# Patient Record
Sex: Male | Born: 1980 | Hispanic: No | Marital: Single | Smoking: Never smoker
Health system: Southern US, Community
[De-identification: ages and names within clinical notes are randomized; demographics above are authoritative.]

---

## 1998-01-24 ENCOUNTER — Emergency Department (HOSPITAL_COMMUNITY): Admission: EM | Admit: 1998-01-24 | Discharge: 1998-01-24 | Payer: Self-pay | Admitting: *Deleted

## 1998-02-12 ENCOUNTER — Emergency Department (HOSPITAL_COMMUNITY): Admission: EM | Admit: 1998-02-12 | Discharge: 1998-02-12 | Payer: Self-pay | Admitting: Emergency Medicine

## 1998-08-01 ENCOUNTER — Encounter: Payer: Self-pay | Admitting: *Deleted

## 1998-08-01 ENCOUNTER — Emergency Department (HOSPITAL_COMMUNITY): Admission: EM | Admit: 1998-08-01 | Discharge: 1998-08-01 | Payer: Self-pay | Admitting: Emergency Medicine

## 2003-09-21 ENCOUNTER — Emergency Department (HOSPITAL_COMMUNITY): Admission: EM | Admit: 2003-09-21 | Discharge: 2003-09-21 | Payer: Self-pay | Admitting: Emergency Medicine

## 2007-03-14 ENCOUNTER — Emergency Department (HOSPITAL_COMMUNITY): Admission: EM | Admit: 2007-03-14 | Discharge: 2007-03-14 | Payer: Self-pay | Admitting: Emergency Medicine

## 2007-03-23 ENCOUNTER — Emergency Department (HOSPITAL_COMMUNITY): Admission: EM | Admit: 2007-03-23 | Discharge: 2007-03-23 | Payer: Self-pay | Admitting: Emergency Medicine

## 2007-04-17 ENCOUNTER — Emergency Department (HOSPITAL_COMMUNITY): Admission: EM | Admit: 2007-04-17 | Discharge: 2007-04-18 | Payer: Self-pay | Admitting: Emergency Medicine

## 2007-04-25 ENCOUNTER — Emergency Department (HOSPITAL_COMMUNITY): Admission: EM | Admit: 2007-04-25 | Discharge: 2007-04-25 | Payer: Self-pay | Admitting: Emergency Medicine

## 2007-12-18 ENCOUNTER — Emergency Department (HOSPITAL_COMMUNITY): Admission: EM | Admit: 2007-12-18 | Discharge: 2007-12-18 | Payer: Self-pay | Admitting: Emergency Medicine

## 2011-01-09 ENCOUNTER — Emergency Department (HOSPITAL_COMMUNITY)
Admission: EM | Admit: 2011-01-09 | Discharge: 2011-01-09 | Disposition: A | Discharge status: Home / Self Care | Payer: Self-pay | Attending: Emergency Medicine | Admitting: Emergency Medicine

## 2011-01-09 ENCOUNTER — Emergency Department (HOSPITAL_COMMUNITY): Payer: Self-pay

## 2011-01-09 DIAGNOSIS — F1010 Alcohol abuse, uncomplicated: Secondary | ICD-10-CM | POA: Insufficient documentation

## 2011-01-09 DIAGNOSIS — S40019A Contusion of unspecified shoulder, initial encounter: Secondary | ICD-10-CM | POA: Insufficient documentation

## 2011-01-09 DIAGNOSIS — M542 Cervicalgia: Secondary | ICD-10-CM | POA: Insufficient documentation

## 2011-01-09 DIAGNOSIS — R51 Headache: Secondary | ICD-10-CM | POA: Insufficient documentation

## 2011-01-09 DIAGNOSIS — R079 Chest pain, unspecified: Secondary | ICD-10-CM | POA: Insufficient documentation

## 2011-03-07 ENCOUNTER — Emergency Department (HOSPITAL_COMMUNITY): Payer: Self-pay

## 2011-03-07 ENCOUNTER — Emergency Department (HOSPITAL_COMMUNITY)
Admission: EM | Admit: 2011-03-07 | Discharge: 2011-03-07 | Disposition: A | Discharge status: Home / Self Care | Payer: Self-pay | Attending: Emergency Medicine | Admitting: Emergency Medicine

## 2011-03-07 DIAGNOSIS — S61409A Unspecified open wound of unspecified hand, initial encounter: Secondary | ICD-10-CM | POA: Insufficient documentation

## 2011-03-07 DIAGNOSIS — M79609 Pain in unspecified limb: Secondary | ICD-10-CM | POA: Insufficient documentation

## 2011-03-07 DIAGNOSIS — M7989 Other specified soft tissue disorders: Secondary | ICD-10-CM | POA: Insufficient documentation

## 2011-03-07 DIAGNOSIS — W2209XA Striking against other stationary object, initial encounter: Secondary | ICD-10-CM | POA: Insufficient documentation

## 2011-06-13 LAB — URINALYSIS, ROUTINE W REFLEX MICROSCOPIC
Glucose, UA: NEGATIVE
Specific Gravity, Urine: 1.024
pH: 6

## 2018-01-03 ENCOUNTER — Encounter (HOSPITAL_COMMUNITY): Payer: Self-pay | Admitting: Family Medicine

## 2018-01-03 ENCOUNTER — Ambulatory Visit (HOSPITAL_COMMUNITY)
Admission: EM | Admit: 2018-01-03 | Discharge: 2018-01-03 | Disposition: A | Discharge status: Home / Self Care | Payer: Self-pay | Attending: Urgent Care | Admitting: Urgent Care

## 2018-01-03 DIAGNOSIS — F12988 Cannabis use, unspecified with other cannabis-induced disorder: Secondary | ICD-10-CM

## 2018-01-03 DIAGNOSIS — R197 Diarrhea, unspecified: Secondary | ICD-10-CM

## 2018-01-03 DIAGNOSIS — F1290 Cannabis use, unspecified, uncomplicated: Secondary | ICD-10-CM

## 2018-01-03 DIAGNOSIS — R112 Nausea with vomiting, unspecified: Secondary | ICD-10-CM

## 2018-01-03 MED ORDER — ONDANSETRON 4 MG PO TBDP: 8.0000 mg | ORAL_TABLET | Freq: Once | ORAL | Status: DC

## 2018-01-03 MED ORDER — ONDANSETRON 4 MG PO TBDP
ORAL_TABLET | ORAL | Status: AC
  Filled 2018-01-03: qty 2

## 2018-01-03 MED ORDER — ONDANSETRON 4 MG PO TBDP: 4.0000 mg | ORAL_TABLET | Freq: Three times a day (TID) | ORAL | 0 refills | Status: DC | PRN

## 2018-01-03 NOTE — ED Triage Notes (Signed)
Pt here for fever, generalized abd pain and vomiting since yesterday.

## 2018-01-03 NOTE — ED Provider Notes (Signed)
  MRN: [id number] DOB: [birth year]  Subjective:    Raina Minaedro Huerta-Morales is a 37 y.o. male presenting for 1 day history of nausea with vomiting, loose stools.  Patient has had associated subjective fever and generalized belly pain.  He has not tried any medications for relief.  He is trying to stay well-hydrated but is not tolerating foods very well.  Admits that he smokes marijuana every day and has smoked marijuana for years.  Denies history of chronic GI issues including ulcerative colitis, Crohn's disease, GERD.  Not currently taking any medications and has No Known Allergies.  Nice past medical history and past surgical history.  Objective:   Vitals: BP (!) 143/76   Pulse 62   Temp 98.6 F (37 C)   Resp 18   SpO2 100%   Physical Exam  Constitutional: He is oriented to person, place, and time. He appears well-developed and well-nourished.  HENT:  Mucous membranes dry.  Eyes: No scleral icterus.  Cardiovascular: Normal rate, regular rhythm and intact distal pulses. Exam reveals no gallop and no friction rub.  No murmur heard. Pulmonary/Chest: No respiratory distress. He has no wheezes. He has no rales.  Abdominal: Soft. Bowel sounds are normal. He exhibits no distension and no mass. There is tenderness (mild, generalized throughout). There is no rebound and no guarding.  Neurological: He is alert and oriented to person, place, and time.  Skin: Skin is warm and dry.  Psychiatric: He has a normal mood and affect.   Assessment and Plan :   Cannabinoid hyperemesis syndrome (HCC)  Nausea vomiting and diarrhea  Marijuana use, continuous  Counseled patient on risks of continuous marijuana use and cannabis induced hyperemesis syndrome.  Patient will try Zofran for his nausea and vomiting, emphasized need for aggressive hydration.  Return to clinic precautions discussed.   Wallis BambergMani, Amparo Donalson, [place]-C 01/04/18 [phone]

## 2020-07-13 ENCOUNTER — Encounter (HOSPITAL_COMMUNITY): Payer: Self-pay

## 2020-07-13 ENCOUNTER — Other Ambulatory Visit: Payer: Self-pay

## 2020-07-13 ENCOUNTER — Ambulatory Visit (HOSPITAL_COMMUNITY)
Admission: EM | Admit: 2020-07-13 | Discharge: 2020-07-13 | Disposition: A | Discharge status: Home / Self Care | Payer: Self-pay | Attending: Internal Medicine | Admitting: Internal Medicine

## 2020-07-13 DIAGNOSIS — L02414 Cutaneous abscess of left upper limb: Secondary | ICD-10-CM

## 2020-07-13 MED ORDER — DOXYCYCLINE HYCLATE 100 MG PO CAPS: 100.0000 mg | ORAL_CAPSULE | Freq: Two times a day (BID) | ORAL | 0 refills | Status: AC

## 2020-07-13 MED ORDER — IBUPROFEN 600 MG PO TABS: 600.0000 mg | ORAL_TABLET | Freq: Four times a day (QID) | ORAL | 0 refills | Status: DC | PRN

## 2020-07-13 NOTE — ED Triage Notes (Signed)
Pt reports he was bit by a spider in the left arm 2 weeks ago. States he is having pain and discharge coming out of the area of the bite.

## 2020-07-14 NOTE — ED Provider Notes (Signed)
[facility]    CSN: [id number] Arrival date & time: 07/13/20  1908      History   Chief Complaint Chief Complaint  Patient presents with  . Insect Bite    HPI Samuel Vincent is a 39 y.o. male comes to urgent care with a 2-week history of painful swelling in the left upper arm.  Started 2 weeks ago after spider bite.  Apparently witnessed spider bite.  He took some penicillin sent to him by his mother from [place].   He is unsure about which penicillin he took.  The swelling has worsened and over the past couple of days he has noticed drainage from it.  He squeezed out significant amount of drainage before coming to the urgent care to be evaluated.  Patient denies any fever or chills. History reviewed. No pertinent past medical history.  There are no problems to display for this patient.   History reviewed. No pertinent surgical history.     Home Medications    Prior to Admission medications   Medication Sig Start Date End Date Taking? Authorizing Provider  doxycycline (VIBRAMYCIN) 100 MG capsule Take 1 capsule (100 mg total) by mouth 2 (two) times daily for 5 days. 07/13/20 07/18/20  Merrilee Jansky, MD  ibuprofen (ADVIL) 600 MG tablet Take 1 tablet (600 mg total) by mouth every 6 (six) hours as needed. 07/13/20   Marcoantonio Legault, Britta Mccreedy, MD    Family History History reviewed. No pertinent family history.  Social History Social History   Tobacco Use  . Smoking status: Never Smoker  . Smokeless tobacco: Never Used  Substance Use Topics  . Alcohol use: Not Currently  . Drug use: Never     Allergies   Patient has no known allergies.   Review of Systems Review of Systems  Constitutional: Negative for chills and fever.  Respiratory: Negative.   Gastrointestinal: Negative.   Musculoskeletal: Negative for arthralgias and myalgias.  Skin: Positive for color change and wound. Negative for pallor and rash.     Physical Exam Triage Vital Signs ED  Triage Vitals  Enc Vitals Group     BP 07/13/20 2033 136/78     Pulse Rate 07/13/20 2033 68     Resp 07/13/20 2033 16     Temp 07/13/20 2033 98.1 F (36.7 C)     Temp Source 07/13/20 2033 Oral     SpO2 07/13/20 2033 100 %     Weight --      Height --      Head Circumference --      Peak Flow --      Pain Score 07/13/20 2034 6     Pain Loc --      Pain Edu? --      Excl. in GC? --    No data found.  Updated Vital Signs BP 136/78 (BP Location: Right Arm)   Pulse 68   Temp 98.1 F (36.7 C) (Oral)   Resp 16   SpO2 100%   Visual Acuity Right Eye Distance:   Left Eye Distance:   Bilateral Distance:    Right Eye Near:   Left Eye Near:    Bilateral Near:     Physical Exam Vitals and nursing note reviewed.  Constitutional:      Appearance: Normal appearance.  Cardiovascular:     Pulses: Normal pulses.     Heart sounds: Normal heart sounds.  Pulmonary:     Effort: Pulmonary effort is normal.  Breath sounds: Normal breath sounds.  Abdominal:     General: Bowel sounds are normal.     Palpations: Abdomen is soft.  Skin:    General: Skin is warm.     Findings: Erythema and lesion present.     Comments: Tenderness on the medial aspect of the left upper arm.  Indurated areas about 1 inch in the longest diameter.  It appears to have drained.  Mild erythema around the indurated area.  Neurological:     Mental Status: He is alert.      UC Treatments / Results  Labs (all labs ordered are listed, but only abnormal results are displayed) Labs Reviewed - No data to display  EKG   Radiology No results found.  Procedures Procedures (including critical care time)  Medications Ordered in UC Medications - No data to display  Initial Impression / Assessment and Plan / UC Course  I have reviewed the triage vital signs and the nursing notes.  Pertinent labs & imaging results that were available during my care of the patient were reviewed by me and considered in my  medical decision making (see chart for details).     1.  Skin abscess with surrounding cellulitis, drained spontaneously: Suspect this is MRSA skin abscess Doxycycline 100 mg twice daily for 5 days Ibuprofen 600 mg every 6 hours as needed for pain Return precautions given No drainable abscess noted. Final Clinical Impressions(s) / UC Diagnoses   Final diagnoses:  Cutaneous abscess of left upper extremity   Discharge Instructions   None    ED Prescriptions    Medication Sig Dispense Auth. Provider   doxycycline (VIBRAMYCIN) 100 MG capsule Take 1 capsule (100 mg total) by mouth 2 (two) times daily for 5 days. 10 capsule Lyndall Bellot, Britta Mccreedy, MD   ibuprofen (ADVIL) 600 MG tablet Take 1 tablet (600 mg total) by mouth every 6 (six) hours as needed. 30 tablet Noelia Lenart, Britta Mccreedy, MD     PDMP not reviewed this encounter.   Merrilee Jansky, MD 07/14/20 [phone]

## 2021-01-20 ENCOUNTER — Other Ambulatory Visit: Payer: Self-pay

## 2021-01-20 ENCOUNTER — Emergency Department (HOSPITAL_COMMUNITY)
Admission: EM | Admit: 2021-01-20 | Discharge: 2021-01-21 | Disposition: A | Discharge status: Home / Self Care | Payer: Self-pay | Attending: Emergency Medicine | Admitting: Emergency Medicine

## 2021-01-20 DIAGNOSIS — N4889 Other specified disorders of penis: Secondary | ICD-10-CM | POA: Insufficient documentation

## 2021-01-20 DIAGNOSIS — N50812 Left testicular pain: Secondary | ICD-10-CM

## 2021-01-20 DIAGNOSIS — R112 Nausea with vomiting, unspecified: Secondary | ICD-10-CM | POA: Insufficient documentation

## 2021-01-20 DIAGNOSIS — R1033 Periumbilical pain: Secondary | ICD-10-CM

## 2021-01-20 LAB — CBC WITH DIFFERENTIAL/PLATELET
Abs Immature Granulocytes: 0.05 10*3/uL (ref 0.00–0.07)
Basophils Absolute: 0 10*3/uL (ref 0.0–0.1)
Basophils Relative: 0 %
Eosinophils Absolute: 0.2 10*3/uL (ref 0.0–0.5)
Eosinophils Relative: 1 %
HCT: 45.1 % (ref 39.0–52.0)
Hemoglobin: 15.3 g/dL (ref 13.0–17.0)
Immature Granulocytes: 0 %
Lymphocytes Relative: 21 %
Lymphs Abs: 2.9 10*3/uL (ref 0.7–4.0)
MCH: 31.3 pg (ref 26.0–34.0)
MCHC: 33.9 g/dL (ref 30.0–36.0)
MCV: 92.2 fL (ref 80.0–100.0)
Monocytes Absolute: 0.9 10*3/uL (ref 0.1–1.0)
Monocytes Relative: 7 %
Neutro Abs: 9.7 10*3/uL — ABNORMAL HIGH (ref 1.7–7.7)
Neutrophils Relative %: 71 %
Platelets: 164 10*3/uL (ref 150–400)
RBC: 4.89 MIL/uL (ref 4.22–5.81)
RDW: 13.2 % (ref 11.5–15.5)
WBC: 13.7 10*3/uL — ABNORMAL HIGH (ref 4.0–10.5)
nRBC: 0 % (ref 0.0–0.2)

## 2021-01-20 LAB — URINALYSIS, ROUTINE W REFLEX MICROSCOPIC
Bilirubin Urine: NEGATIVE
Glucose, UA: NEGATIVE mg/dL
Hgb urine dipstick: NEGATIVE
Ketones, ur: 5 mg/dL — AB
Leukocytes,Ua: NEGATIVE
Nitrite: NEGATIVE
Protein, ur: NEGATIVE mg/dL
Specific Gravity, Urine: 1.026 (ref 1.005–1.030)
pH: 6 (ref 5.0–8.0)

## 2021-01-20 LAB — COMPREHENSIVE METABOLIC PANEL
ALT: 19 U/L (ref 0–44)
AST: 19 U/L (ref 15–41)
Albumin: 4.3 g/dL (ref 3.5–5.0)
Alkaline Phosphatase: 74 U/L (ref 38–126)
Anion gap: 9 (ref 5–15)
BUN: 19 mg/dL (ref 6–20)
CO2: 24 mmol/L (ref 22–32)
Calcium: 8.9 mg/dL (ref 8.9–10.3)
Chloride: 103 mmol/L (ref 98–111)
Creatinine, Ser: 0.85 mg/dL (ref 0.61–1.24)
GFR, Estimated: >60 mL/min (ref >60)
Glucose, Bld: 102 mg/dL — ABNORMAL HIGH (ref 70–99)
Potassium: 3.7 mmol/L (ref 3.5–5.1)
Sodium: 136 mmol/L (ref 135–145)
Total Bilirubin: 0.7 mg/dL (ref 0.3–1.2)
Total Protein: 7.3 g/dL (ref 6.5–8.1)

## 2021-01-20 LAB — LIPASE, BLOOD: Lipase: 95 U/L — ABNORMAL HIGH (ref 11–51)

## 2021-01-20 LAB — TROPONIN I (HIGH SENSITIVITY): Troponin I (High Sensitivity): 5 ng/L (ref <18)

## 2021-01-20 MED ORDER — HYDROCODONE-ACETAMINOPHEN 5-325 MG PO TABS
1.0000 | ORAL_TABLET | Freq: Once | ORAL | Status: AC
  Administered 2021-01-20: 1 via ORAL
  Filled 2021-01-20: qty 1

## 2021-01-20 NOTE — ED Provider Notes (Addendum)
Emergency Medicine Provider Triage Evaluation Note  Samuel Vincent , a 40 y.o. male  was evaluated in triage.  Pt complains of acute, severe abdominal pain. Started this morning and has been constant. Worse along the middle of the abdomen, states his abdomen feels rigid. Says pain has been worsening throughout the day. It radiates up to his chest. He has vomited twice. Reports constipation, but states he passed stool today. No blood in the stool.   No previous abdominal surgeries.   Translator services were used during this visit.   Review of Systems  Positive: Abdominal pain, nausea, vomiting, chest pain Negative: Fever, chills, diarrhea, dysuria   Physical Exam  BP (!) 150/86 (BP Location: Right Arm)   Pulse 70   Temp 97.7 F (36.5 C) (Oral)   Resp 16   SpO2 100%  Gen:   Awake, uncomfortable appearing. Clutching belly.  Resp:  Lungs CTA. Increased effort due to pain MSK:   Moves extremities without difficulty  Abdomen Abdomen very tender.TTP epigastric pain, LLQ and RLQ. Voluntary guarding. No rebound. No palpable  masses.    Medical Decision Making  Medically screening exam initiated at 9:29 PM.  Appropriate orders placed.  Samuel Vincent was informed that the remainder of the evaluation will be completed by another provider, this initial triage assessment does not replace that evaluation, and the importance of remaining in the ED until their evaluation is complete.  Abdominal pain    Theron Arista, PA-C 01/20/21 2134    Theron Arista, PA-C 01/20/21 2138    Cathren Laine, MD 01/25/21 1600

## 2021-01-20 NOTE — ED Triage Notes (Signed)
Interpreter [phone]. Lower abdominal pain started while at work. Denies any heavy lifting. Also reports vomiting x 3 episodes and constipation.

## 2021-01-21 ENCOUNTER — Emergency Department (HOSPITAL_COMMUNITY): Payer: Self-pay

## 2021-01-21 LAB — TROPONIN I (HIGH SENSITIVITY): Troponin I (High Sensitivity): 2 ng/L (ref <18)

## 2021-01-21 MED ORDER — ONDANSETRON 4 MG PO TBDP
4.0000 mg | ORAL_TABLET | Freq: Once | ORAL | Status: AC
  Administered 2021-01-21: 4 mg via ORAL
  Filled 2021-01-21: qty 1

## 2021-01-21 MED ORDER — OXYCODONE-ACETAMINOPHEN 5-325 MG PO TABS
1.0000 | ORAL_TABLET | Freq: Once | ORAL | Status: AC
  Administered 2021-01-21: 1 via ORAL
  Filled 2021-01-21: qty 1

## 2021-01-21 MED ORDER — METOCLOPRAMIDE HCL 10 MG PO TABS: 10.0000 mg | ORAL_TABLET | Freq: Four times a day (QID) | ORAL | 0 refills | Status: DC

## 2021-01-21 NOTE — ED Notes (Signed)
Patient transported to Ultrasound 

## 2021-01-21 NOTE — Discharge Instructions (Addendum)
Your tests are all negative. Take the medication for pain and nausea as prescribed.   If you have a high fever, severe pain, new concerning symptom, please return to the emergency department for further evaluation.

## 2021-01-21 NOTE — ED Provider Notes (Signed)
[facility] EMERGENCY DEPARTMENT Provider Note   CSN: [id number] Arrival date & time: 01/20/21  2051     History Chief Complaint  Patient presents with  . Abdominal Pain    Samuel Vincent is a 40 y.o. male.  Patient to ED for evaluation of LLQ abdominal and left testicular pain that started around noon yesterday (5/4). When it started he had nausea with 3 episodes of vomiting. No vomiting since but nausea has continued. His last bowel movement was yesterday after the pain started, and he had one prior to beginning of pain. No melena or bloody stools. No history of similar pain. No urinary symptoms. No fever. He took a medication for pain at home that was left over from a previous arm injury but he does not know what it was, but provided no relief. No previous abdominal surgeries.  The history is provided by the patient. A language interpreter was used.  Abdominal Pain Associated symptoms: nausea and vomiting   Associated symptoms: no constipation, no dysuria and no fever        No past medical history on file.  There are no problems to display for this patient.   No past surgical history on file.     No family history on file.  Social History   Tobacco Use  . Smoking status: Never Smoker  . Smokeless tobacco: Never Used  Substance Use Topics  . Alcohol use: Not Currently  . Drug use: Never    Home Medications Prior to Admission medications   Medication Sig Start Date End Date Taking? Authorizing Provider  ibuprofen (ADVIL) 600 MG tablet Take 1 tablet (600 mg total) by mouth every 6 (six) hours as needed. 07/13/20   Lamptey, Britta Mccreedy, MD    Allergies    Patient has no known allergies.  Review of Systems   Review of Systems  Constitutional: Negative for fever.  HENT: Negative.   Respiratory: Negative.   Cardiovascular: Negative.   Gastrointestinal: Positive for abdominal pain, nausea and vomiting. Negative for blood in stool and  constipation.  Genitourinary: Positive for testicular pain. Negative for dysuria and scrotal swelling.  Musculoskeletal: Negative.   Neurological: Negative.     Physical Exam Updated Vital Signs BP (!) 147/94 (BP Location: Right Arm)   Pulse (!) 57   Temp 99.2 F (37.3 C) (Oral)   Resp 20   SpO2 100%   Physical Exam Vitals and nursing note reviewed.  Constitutional:      Appearance: He is well-developed.  HENT:     Head: Normocephalic.  Cardiovascular:     Rate and Rhythm: Normal rate and regular rhythm.  Pulmonary:     Effort: Pulmonary effort is normal.     Breath sounds: Normal breath sounds.  Abdominal:     General: Bowel sounds are normal.     Palpations: Abdomen is soft.     Tenderness: There is abdominal tenderness (Left periumbilical area without bulge or mass) in the periumbilical area. There is no guarding or rebound.     Comments: Abdomen is diffusely tender patient reporting any palpation increases the left periumbilical pain  Genitourinary:    Comments: Deferred due to patient located in the hallway Musculoskeletal:        General: Normal range of motion.     Cervical back: Normal range of motion and neck supple.  Skin:    General: Skin is warm and dry.     Findings: No rash.  Neurological:  Mental Status: He is alert and oriented to person, place, and time.     ED Results / Procedures / Treatments   Labs (all labs ordered are listed, but only abnormal results are displayed) Labs Reviewed  CBC WITH DIFFERENTIAL/PLATELET - Abnormal; Notable for the following components:      Result Value   WBC 13.7 (*)    Neutro Abs 9.7 (*)    All other components within normal limits  COMPREHENSIVE METABOLIC PANEL - Abnormal; Notable for the following components:   Glucose, Bld 102 (*)    All other components within normal limits  LIPASE, BLOOD - Abnormal; Notable for the following components:   Lipase 95 (*)    All other components within normal limits   URINALYSIS, ROUTINE W REFLEX MICROSCOPIC - Abnormal; Notable for the following components:   Ketones, ur 5 (*)    All other components within normal limits  TROPONIN I (HIGH SENSITIVITY)  TROPONIN I (HIGH SENSITIVITY)   Results for orders placed or performed during the hospital encounter of 01/20/21  CBC with Differential  Result Value Ref Range   WBC 13.7 (H) 4.0 - 10.5 K/uL   RBC 4.89 4.22 - 5.81 MIL/uL   Hemoglobin 15.3 13.0 - 17.0 g/dL   HCT 40.945.1 81.139.0 - 91.452.0 %   MCV 92.2 80.0 - 100.0 fL   MCH 31.3 26.0 - 34.0 pg   MCHC 33.9 30.0 - 36.0 g/dL   RDW 78.213.2 95.611.5 - 21.315.5 %   Platelets 164 150 - 400 K/uL   nRBC 0.0 0.0 - 0.2 %   Neutrophils Relative % 71 %   Neutro Abs 9.7 (H) 1.7 - 7.7 K/uL   Lymphocytes Relative 21 %   Lymphs Abs 2.9 0.7 - 4.0 K/uL   Monocytes Relative 7 %   Monocytes Absolute 0.9 0.1 - 1.0 K/uL   Eosinophils Relative 1 %   Eosinophils Absolute 0.2 0.0 - 0.5 K/uL   Basophils Relative 0 %   Basophils Absolute 0.0 0.0 - 0.1 K/uL   Immature Granulocytes 0 %   Abs Immature Granulocytes 0.05 0.00 - 0.07 K/uL  Comprehensive metabolic panel  Result Value Ref Range   Sodium 136 135 - 145 mmol/L   Potassium 3.7 3.5 - 5.1 mmol/L   Chloride 103 98 - 111 mmol/L   CO2 24 22 - 32 mmol/L   Glucose, Bld 102 (H) 70 - 99 mg/dL   BUN 19 6 - 20 mg/dL   Creatinine, Ser 0.860.85 0.61 - 1.24 mg/dL   Calcium 8.9 8.9 - 57.810.3 mg/dL   Total Protein 7.3 6.5 - 8.1 g/dL   Albumin 4.3 3.5 - 5.0 g/dL   AST 19 15 - 41 U/L   ALT 19 0 - 44 U/L   Alkaline Phosphatase 74 38 - 126 U/L   Total Bilirubin 0.7 0.3 - 1.2 mg/dL   GFR, Estimated >46>60 >96>60 mL/min   Anion gap 9 5 - 15  Lipase, blood  Result Value Ref Range   Lipase 95 (H) 11 - 51 U/L  Urinalysis, Routine w reflex microscopic Urine, Clean Catch  Result Value Ref Range   Color, Urine YELLOW YELLOW   APPearance CLEAR CLEAR   Specific Gravity, Urine 1.026 1.005 - 1.030   pH 6.0 5.0 - 8.0   Glucose, UA NEGATIVE NEGATIVE mg/dL   Hgb  urine dipstick NEGATIVE NEGATIVE   Bilirubin Urine NEGATIVE NEGATIVE   Ketones, ur 5 (A) NEGATIVE mg/dL   Protein, ur NEGATIVE NEGATIVE mg/dL  Nitrite NEGATIVE NEGATIVE   Leukocytes,Ua NEGATIVE NEGATIVE  Troponin I (High Sensitivity)  Result Value Ref Range   Troponin I (High Sensitivity) 5 <18 ng/L  Troponin I (High Sensitivity)  Result Value Ref Range   Troponin I (High Sensitivity) 2 <18 ng/L    EKG EKG Interpretation  Date/Time:  Wednesday Jan 20 2021 21:37:47 EDT Ventricular Rate:  65 PR Interval:  134 QRS Duration: 92 QT Interval:  398 QTC Calculation: 413 R Axis:   92 Text Interpretation: Normal sinus rhythm Rightward axis Incomplete right bundle branch block Borderline ECG Confirmed by Zadie Rhine ([id number]) on 01/21/2021 2:04:05 AM   Radiology CT Renal Stone Study  Result Date: 01/21/2021 CLINICAL DATA:  Lower abdominal pain which began at work EXAM: CT ABDOMEN AND PELVIS WITHOUT CONTRAST TECHNIQUE: Multidetector CT imaging of the abdomen and pelvis was performed following the standard protocol without IV contrast. COMPARISON:  None. FINDINGS: Lower chest: Scattered small sub 5 mm pulmonary nodules are present in both lung bases (4/4, 10, 11). Lung bases otherwise clear. Normal heart size. No pericardial effusion. Hepatobiliary: No visible focal liver lesion within the limitations of an unenhanced CT. Diffuse hepatic hypoattenuation compatible with hepatic steatosis. Mild sparing along the gallbladder fossa. Smooth liver surface contour. Normal gallbladder and biliary tree without visible calcified gallstone. Pancreas: No pancreatic ductal dilatation or surrounding inflammatory changes. Spleen: Normal in size. No concerning splenic lesions. Adrenals/Urinary Tract: Normal adrenal glands. Kidneys are symmetric in size and normally located. Mild bilateral perinephric stranding is nonspecific. No visible or contour deforming renal lesion. No obstructive urolithiasis or  hydronephrosis. Urinary bladder is decompressed at the time of exam. Mild bladder wall thickening is therefore nonspecific given underdistention. Stomach/Bowel: Trace fluid in the distal thoracic esophagus. Distal esophagus otherwise unremarkable. Stomach and duodenum are free of acute abnormality. Normal sweep across the midline abdomen. No small bowel thickening or dilatation. Appendix is not visualized. No focal inflammation the vicinity of the cecum to suggest an occult appendicitis. No colonic dilatation or wall thickening. Vascular/Lymphatic: No significant vascular findings are present. No enlarged abdominal or pelvic lymph nodes. Reproductive: Benign-appearing coarse eccentric calcification of the prostate. No concerning abnormalities of the prostate or seminal vesicles. Other: No abdominopelvic free fluid or free gas. No bowel containing hernias. Musculoskeletal: No acute osseous abnormality or suspicious osseous lesion. IMPRESSION: 1. Mild bladder wall thickening is nonspecific given underdistention though given the presence of mild bilateral perinephric stranding as well, recommend urinalysis to exclude cystitis and ascending tract infection. 2. Trace fluid in the distal thoracic esophagus, correlate for features of reflux. 3. Several scattered small sub 5 mm nodules are present in the lung bases. No follow-up needed if patient is low-risk (and has no known or suspected primary neoplasm). Non-contrast chest CT can be considered in 12 months if patient is high-risk. This recommendation follows the consensus statement: Guidelines for Management of Incidental Pulmonary Nodules Detected on CT Images: From the Fleischner Society 2017; Radiology 2017; 284:228-243. Electronically Signed   By: Kreg Shropshire M.D.   On: 01/21/2021 00:21    Procedures Procedures   Medications Ordered in ED Medications  HYDROcodone-acetaminophen (NORCO/VICODIN) 5-325 MG per tablet 1 tablet (1 tablet Oral Given 01/20/21 2153)     ED Course  I have reviewed the triage vital signs and the nursing notes.  Pertinent labs & imaging results that were available during my care of the patient were reviewed by me and considered in my medical decision making (see chart for details).  MDM Rules/Calculators/A&P                          Patient to ED with abdominal pain starting yesterday around noon. No fever. N with V x 3, persistent nausea since. He reports left testicular pain at the onset that resolved over time. Abdominal pain continues.   VSS. Patient has diffuse abdominal tenderness. Soft abdomen, nondistended. Non-con CT without concerning finding.   Labs reassuring. There is mild leukocytosis that is nonspecific. Lipase minimally elevated at 95. No specific LUQ tenderness. No inflammatory changes of the pancreas on CT. Do not feel this is contributory.   The patient reported testicular pain at onset of symptoms. US performed and shows only hydroceles - also present on previous study.   Pain is controlled with oral medication in the ED. There are no findings on lab and xrays to identify the patient's pain. Recommend follow up with PCP. Referral provided. Will Rx Reglan.   Final Clinical Impression(s) / ED Diagnoses Final diagnoses:  None   1. Abdominal pain 2. Nausea 3. Testicular pain  Rx / DC Orders ED Discharge Orders    None       Elpidio Anis, PA-C 01/21/21 7026    Rozelle Logan, DO 01/21/21 [phone]

## 2022-01-08 ENCOUNTER — Emergency Department (HOSPITAL_COMMUNITY)
Admission: EM | Admit: 2022-01-08 | Discharge: 2022-01-09 | Disposition: A | Discharge status: Home / Self Care | Payer: Self-pay | Attending: Emergency Medicine | Admitting: Emergency Medicine

## 2022-01-08 DIAGNOSIS — M545 Low back pain, unspecified: Secondary | ICD-10-CM | POA: Diagnosis not present

## 2022-01-08 DIAGNOSIS — R911 Solitary pulmonary nodule: Secondary | ICD-10-CM | POA: Diagnosis not present

## 2022-01-08 DIAGNOSIS — Y92410 Unspecified street and highway as the place of occurrence of the external cause: Secondary | ICD-10-CM | POA: Insufficient documentation

## 2022-01-08 DIAGNOSIS — M546 Pain in thoracic spine: Secondary | ICD-10-CM | POA: Insufficient documentation

## 2022-01-08 DIAGNOSIS — M542 Cervicalgia: Secondary | ICD-10-CM | POA: Diagnosis not present

## 2022-01-08 DIAGNOSIS — R519 Headache, unspecified: Secondary | ICD-10-CM | POA: Insufficient documentation

## 2022-01-08 DIAGNOSIS — R1084 Generalized abdominal pain: Secondary | ICD-10-CM | POA: Diagnosis not present

## 2022-01-08 DIAGNOSIS — M7918 Myalgia, other site: Secondary | ICD-10-CM

## 2022-01-09 ENCOUNTER — Emergency Department (HOSPITAL_COMMUNITY): Payer: Self-pay

## 2022-01-09 ENCOUNTER — Encounter (HOSPITAL_COMMUNITY): Payer: Self-pay

## 2022-01-09 LAB — BASIC METABOLIC PANEL
Anion gap: 7 (ref 5–15)
BUN: 23 mg/dL — ABNORMAL HIGH (ref 6–20)
CO2: 25 mmol/L (ref 22–32)
Calcium: 9.3 mg/dL (ref 8.9–10.3)
Chloride: 107 mmol/L (ref 98–111)
Creatinine, Ser: 1.11 mg/dL (ref 0.61–1.24)
GFR, Estimated: >60 mL/min (ref >60)
Glucose, Bld: 109 mg/dL — ABNORMAL HIGH (ref 70–99)
Potassium: 3.8 mmol/L (ref 3.5–5.1)
Sodium: 139 mmol/L (ref 135–145)

## 2022-01-09 LAB — CBC WITH DIFFERENTIAL/PLATELET
Abs Immature Granulocytes: 0.02 10*3/uL (ref 0.00–0.07)
Basophils Absolute: 0 10*3/uL (ref 0.0–0.1)
Basophils Relative: 0 %
Eosinophils Absolute: 0.1 10*3/uL (ref 0.0–0.5)
Eosinophils Relative: 2 %
HCT: 45.6 % (ref 39.0–52.0)
Hemoglobin: 15.6 g/dL (ref 13.0–17.0)
Immature Granulocytes: 0 %
Lymphocytes Relative: 33 %
Lymphs Abs: 2.5 10*3/uL (ref 0.7–4.0)
MCH: 31.8 pg (ref 26.0–34.0)
MCHC: 34.2 g/dL (ref 30.0–36.0)
MCV: 92.9 fL (ref 80.0–100.0)
Monocytes Absolute: 0.8 10*3/uL (ref 0.1–1.0)
Monocytes Relative: 11 %
Neutro Abs: 4.2 10*3/uL (ref 1.7–7.7)
Neutrophils Relative %: 54 %
Platelets: 150 10*3/uL (ref 150–400)
RBC: 4.91 MIL/uL (ref 4.22–5.81)
RDW: 12.8 % (ref 11.5–15.5)
WBC: 7.7 10*3/uL (ref 4.0–10.5)
nRBC: 0 % (ref 0.0–0.2)

## 2022-01-09 MED ORDER — METHOCARBAMOL 500 MG PO TABS: 500.0000 mg | ORAL_TABLET | Freq: Three times a day (TID) | ORAL | 0 refills | Status: AC | PRN

## 2022-01-09 MED ORDER — IOHEXOL 300 MG/ML  SOLN
100.0000 mL | Freq: Once | INTRAMUSCULAR | Status: AC | PRN
  Administered 2022-01-09: 100 mL via INTRAVENOUS

## 2022-01-09 MED ORDER — ONDANSETRON HCL 4 MG/2ML IJ SOLN
4.0000 mg | Freq: Once | INTRAMUSCULAR | Status: AC
  Administered 2022-01-09: 4 mg via INTRAVENOUS
  Filled 2022-01-09: qty 2

## 2022-01-09 MED ORDER — HYDROMORPHONE HCL 1 MG/ML IJ SOLN
1.0000 mg | Freq: Once | INTRAMUSCULAR | Status: AC
  Administered 2022-01-09: 1 mg via INTRAVENOUS
  Filled 2022-01-09: qty 1

## 2022-01-09 MED ORDER — IBUPROFEN 800 MG PO TABS: 800.0000 mg | ORAL_TABLET | Freq: Four times a day (QID) | ORAL | 0 refills | Status: AC | PRN

## 2022-01-09 NOTE — Discharge Instructions (Addendum)
Necesita encontrar un m?dico para volver a revisar sus pulmones. La exploraci?n de hoy mostr? n?dulos que deben revisarse nuevamente en seis meses. ?

## 2022-01-09 NOTE — ED Notes (Signed)
Removed IV from pt RAC ?
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
## 2022-01-09 NOTE — ED Notes (Signed)
Personal belongings returned to patient. Patient given back phone, keys, and sunglasses case.  ?
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
## 2024-03-09 ENCOUNTER — Encounter (HOSPITAL_COMMUNITY): Payer: Self-pay

## 2024-03-09 ENCOUNTER — Emergency Department (HOSPITAL_COMMUNITY)
Admission: EM | Admit: 2024-03-09 | Discharge: 2024-03-09 | Disposition: A | Discharge status: Home / Self Care | Payer: Self-pay | Attending: Emergency Medicine | Admitting: Emergency Medicine

## 2024-03-09 ENCOUNTER — Other Ambulatory Visit: Payer: Self-pay

## 2024-03-09 DIAGNOSIS — R000 Tachycardia, unspecified: Secondary | ICD-10-CM | POA: Insufficient documentation

## 2024-03-09 DIAGNOSIS — S80861A Insect bite (nonvenomous), right lower leg, initial encounter: Secondary | ICD-10-CM | POA: Insufficient documentation

## 2024-03-09 DIAGNOSIS — F1010 Alcohol abuse, uncomplicated: Secondary | ICD-10-CM | POA: Insufficient documentation

## 2024-03-09 DIAGNOSIS — Y905 Blood alcohol level of 100-119 mg/100 ml: Secondary | ICD-10-CM | POA: Insufficient documentation

## 2024-03-09 DIAGNOSIS — W57XXXA Bitten or stung by nonvenomous insect and other nonvenomous arthropods, initial encounter: Secondary | ICD-10-CM | POA: Insufficient documentation

## 2024-03-09 LAB — CBC WITH DIFFERENTIAL/PLATELET
Abs Immature Granulocytes: 0.03 10*3/uL (ref 0.00–0.07)
Basophils Absolute: 0 10*3/uL (ref 0.0–0.1)
Basophils Relative: 0 %
Eosinophils Absolute: 0.1 10*3/uL (ref 0.0–0.5)
Eosinophils Relative: 2 %
HCT: 50.5 % (ref 39.0–52.0)
Hemoglobin: 17.4 g/dL — ABNORMAL HIGH (ref 13.0–17.0)
Immature Granulocytes: 0 %
Lymphocytes Relative: 34 %
Lymphs Abs: 2.7 10*3/uL (ref 0.7–4.0)
MCH: 32.6 pg (ref 26.0–34.0)
MCHC: 34.5 g/dL (ref 30.0–36.0)
MCV: 94.7 fL (ref 80.0–100.0)
Monocytes Absolute: 0.8 10*3/uL (ref 0.1–1.0)
Monocytes Relative: 10 %
Neutro Abs: 4.3 10*3/uL (ref 1.7–7.7)
Neutrophils Relative %: 54 %
Platelets: 231 10*3/uL (ref 150–400)
RBC: 5.33 MIL/uL (ref 4.22–5.81)
RDW: 14 % (ref 11.5–15.5)
WBC: 7.9 10*3/uL (ref 4.0–10.5)
nRBC: 0 % (ref 0.0–0.2)

## 2024-03-09 LAB — COMPREHENSIVE METABOLIC PANEL WITH GFR
ALT: 69 U/L — ABNORMAL HIGH (ref 0–44)
AST: 41 U/L (ref 15–41)
Albumin: 4.3 g/dL (ref 3.5–5.0)
Alkaline Phosphatase: 68 U/L (ref 38–126)
Anion gap: 14 (ref 5–15)
BUN: 12 mg/dL (ref 6–20)
CO2: 23 mmol/L (ref 22–32)
Calcium: 9.6 mg/dL (ref 8.9–10.3)
Chloride: 103 mmol/L (ref 98–111)
Creatinine, Ser: 0.97 mg/dL (ref 0.61–1.24)
GFR, Estimated: >60 mL/min (ref >60)
Glucose, Bld: 117 mg/dL — ABNORMAL HIGH (ref 70–99)
Potassium: 4.4 mmol/L (ref 3.5–5.1)
Sodium: 140 mmol/L (ref 135–145)
Total Bilirubin: 1 mg/dL (ref 0.0–1.2)
Total Protein: 8 g/dL (ref 6.5–8.1)

## 2024-03-09 LAB — ETHANOL: Alcohol, Ethyl (B): 105 mg/dL — ABNORMAL HIGH (ref <15)

## 2024-03-09 MED ORDER — SODIUM CHLORIDE 0.9 % IV BOLUS
1000.0000 mL | Freq: Once | INTRAVENOUS | Status: AC
  Administered 2024-03-09: 1000 mL via INTRAVENOUS

## 2024-03-09 NOTE — Discharge Instructions (Signed)
 Puede ponerse bacitracian o neosporin a las herida.  Si tiene fiebre o [employer] herida, regrese a la sala de Associate Professor.

## 2024-03-09 NOTE — ED Notes (Signed)
 Pt provided with water
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
## 2024-03-09 NOTE — ED Provider Notes (Signed)
 [place] EMERGENCY DEPARTMENT AT [facility] Provider Note   CSN: [id number] Arrival date & time: 03/09/24  1844     Patient presents with: Insect Bite   Samuel Vincent is a 43 y.o. male.   43y.o male with no PMH presents to the ED with a chief complaint of insect bite.  Patient reports he was in bed yesterday when suddenly he saw a brown spider bite his right ankle, reports he medially push this out, in addition he applied Clorox bleach to the wound, reports today he is feeling sicker from this wound.  Also endorses alcohol intake, although he tells me that it was yesterday when he drank 3 beers but appears clinically intoxicated.  He also tells me he did some cocaine yesterday and not today.  He is accompanied by his brother who tells him you get a tell her the truth .  He does not have any prior medical history, however does not follow with a primary care physician.  He is not complaining of any chest pain, shortness of breath, nausea or vomiting.  The history is provided by the patient and a relative.       Prior to Admission medications   Medication Sig Start Date End Date Taking? Authorizing Provider  ibuprofen  (ADVIL ) 800 MG tablet Take 1 tablet (800 mg total) by mouth every 6 (six) hours as needed for moderate pain. 01/09/22   Pollina, Christopher J, MD  methocarbamol  (ROBAXIN ) 500 MG tablet Take 1 tablet (500 mg total) by mouth every 8 (eight) hours as needed for muscle spasms. 01/09/22   Haze Lonni PARAS, MD    Allergies: Patient has no known allergies.    Review of Systems  Constitutional:  Negative for chills and fever.  Respiratory:  Negative for shortness of breath.   Cardiovascular:  Negative for chest pain.  Gastrointestinal:  Negative for abdominal pain.  Skin:  Positive for wound.  All other systems reviewed and are negative.   Updated Vital Signs BP (!) 133/93   Pulse (!) 105   Temp 98.9 F (37.2 C)   Resp 13   Ht 5' 10 (1.778 m)    Wt 78 kg   SpO2 97%   BMI 24.68 kg/m   Physical Exam Vitals and nursing note reviewed.  Constitutional:      Appearance: Normal appearance.  HENT:     Head: Normocephalic and atraumatic.     Mouth/Throat:     Mouth: Mucous membranes are moist.   Cardiovascular:     Rate and Rhythm: Tachycardia present.     Pulses:          Dorsalis pedis pulses are 2+ on the right side.       Posterior tibial pulses are 2+ on the right side.  Pulmonary:     Effort: Pulmonary effort is normal.     Breath sounds: Rhonchi present. No wheezing.  Abdominal:     General: Abdomen is flat.   Musculoskeletal:     Cervical back: Normal range of motion and neck supple.   Skin:    General: Skin is warm and dry.       Neurological:     Mental Status: He is alert and oriented to person, place, and time.     (all labs ordered are listed, but only abnormal results are displayed) Labs Reviewed  CBC WITH DIFFERENTIAL/PLATELET - Abnormal; Notable for the following components:      Result Value   Hemoglobin 17.4 (*)  All other components within normal limits  COMPREHENSIVE METABOLIC PANEL WITH GFR - Abnormal; Notable for the following components:   Glucose, Bld 117 (*)    ALT 69 (*)    All other components within normal limits  ETHANOL - Abnormal; Notable for the following components:   Alcohol, Ethyl (B) 105 (*)    All other components within normal limits    EKG: EKG Interpretation Date/Time:  Saturday March 09 2024 18:50:44 EDT Ventricular Rate:  158 PR Interval:  94 QRS Duration:  82 QT Interval:  314 QTC Calculation: 509 R Axis:   117  Text Interpretation: Sinus tachycardia with short PR Left posterior fascicular block Cannot rule out Inferior infarct , age undetermined Confirmed by Freddi Hamilton [phone]) on 03/09/2024 7:04:34 PM  Radiology: No results found.   Procedures   Medications Ordered in the ED  sodium chloride 0.9 % bolus 1,000 mL (0 mLs Intravenous Stopped  03/09/24 2114)                                    Medical Decision Making Amount and/or Complexity of Data Reviewed Labs: ordered.    This patient presents to the ED for concern of insect, this involves a number of treatment options, and is a complaint that carries with it a high risk of complications and morbidity.  The differential diagnosis includes sepsis, cellulitis versus other infection.    Co morbidities: Discussed in HPI   Brief History:  See HPI  EMR reviewed including pt PMHx, past surgical history and past visits to ER.   See HPI for more details   Lab Tests:  I ordered and independently interpreted labs.  The pertinent results include:    I personally reviewed all laboratory work and imaging. Metabolic panel without any acute abnormality specifically kidney function within normal limits and no significant electrolyte abnormalities. CBC without leukocytosis or significant anemia. Ethanol slightly elevated 105.   Imaging Studies:  No imaging studies ordered for this patient  Cardiac Monitoring:  The patient was maintained on a cardiac monitor.  I personally viewed and interpreted the cardiac monitored which showed an underlying rhythm of: Sinus tachycardia  EKG non-ischemic   Medicines ordered:  I ordered medication including bolus  for tachycardia/dehydration Reevaluation of the patient after these medicines showed that the patient improved I have reviewed the patients home medicines and have made adjustments as needed  Reevaluation:  After the interventions noted above I re-evaluated patient and found that they have :improved  Social Determinants of Health:  The patient's social determinants of health were a factor in the care of this patient  Problem List / ED Course:  Patient presented to the ED with a chief complaint of spider bite which occurred yesterday, he reports he thought it was brown and small, he cleaned the wound with water, then  applied Clorox to the wound.  He also reports some alcohol use, drinking approximately 3 beers yesterday when he is telling me this history his brother at the bedside states you have to tell her the truth , in addition, he tells me he did a couple of bumps of cocaine also occurring yesterday.  He arrived to the ED with a heart rate in the 150s, EKG shows sinus tachycardia.  No hypoxia, no hypertension, he is afebrile.  Wound appears clean and dry, no erythema noted.  No swelling of his leg, he is afebrile. Labs  obtained reveal a CBC with no leukocytosis, he is hemoconcentrated, CMP with no electro derangement, current levels unremarkable.  LFTs are within normal limits aside from some elevation of ALT.  Ethanol level is 105, he does appear clinically intoxicated at this time.  Given 1 L bolus with improvement in his heart rate to 105.  He tells me he does not have any chest pain, no shortness of breath, no fevers. I do not feel that wound needs antibiotics at this time, I did discuss with him topical ointment, follow-up with PCP which she does not have, therefore given a referral to the [facility]. He is agreeable with plan and treatment at this time, return precautions discussed at length, patient hemodynamically stable for discharge.  Dispostion:  After consideration of the diagnostic results and the patients response to treatment, I feel that the patent would benefit from discontinue alcohol use, will treat wound with topical antibiotic.   Portions of this note were generated with Scientist, clinical (histocompatibility and immunogenetics). Dictation errors may occur despite best attempts at proofreading.   Final diagnoses:  Insect bite of right lower leg, initial encounter    ED Discharge Orders     None          Maureen Broad, PA-C 03/09/24 2222    Freddi Hamilton, MD 03/14/24 [phone]
